# Patient Record
Sex: Female | Born: 1995 | Race: White | Hispanic: No | Marital: Single | Smoking: Never smoker
Health system: Southern US, Community
[De-identification: ages and names within clinical notes are randomized; demographics above are authoritative.]

## PROBLEM LIST (undated history)

## (undated) DIAGNOSIS — F603 Borderline personality disorder: Secondary | ICD-10-CM

## (undated) DIAGNOSIS — F909 Attention-deficit hyperactivity disorder, unspecified type: Secondary | ICD-10-CM

## (undated) DIAGNOSIS — F329 Major depressive disorder, single episode, unspecified: Secondary | ICD-10-CM

---
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
## 2020-12-29 ENCOUNTER — Ambulatory Visit: Payer: Self-pay

## 2020-12-29 ENCOUNTER — Other Ambulatory Visit: Payer: Self-pay

## 2020-12-29 ENCOUNTER — Other Ambulatory Visit: Payer: Self-pay | Admitting: Family Medicine

## 2020-12-29 DIAGNOSIS — M25561 Pain in right knee: Secondary | ICD-10-CM

## 2021-02-21 DIAGNOSIS — M222X1 Patellofemoral disorders, right knee: Secondary | ICD-10-CM | POA: Insufficient documentation

## 2022-03-19 DIAGNOSIS — Z0283 Encounter for blood-alcohol and blood-drug test: Secondary | ICD-10-CM | POA: Insufficient documentation

## 2022-03-29 DIAGNOSIS — R0981 Nasal congestion: Secondary | ICD-10-CM | POA: Insufficient documentation

## 2022-03-29 DIAGNOSIS — J0121 Acute recurrent ethmoidal sinusitis: Secondary | ICD-10-CM | POA: Insufficient documentation

## 2022-03-29 DIAGNOSIS — G43109 Migraine with aura, not intractable, without status migrainosus: Secondary | ICD-10-CM | POA: Insufficient documentation

## 2022-03-29 DIAGNOSIS — J342 Deviated nasal septum: Secondary | ICD-10-CM | POA: Insufficient documentation

## 2022-05-29 DIAGNOSIS — J343 Hypertrophy of nasal turbinates: Secondary | ICD-10-CM | POA: Insufficient documentation

## 2022-05-29 DIAGNOSIS — J322 Chronic ethmoidal sinusitis: Secondary | ICD-10-CM | POA: Insufficient documentation

## 2022-05-29 DIAGNOSIS — J320 Chronic maxillary sinusitis: Secondary | ICD-10-CM | POA: Insufficient documentation

## 2022-07-03 ENCOUNTER — Ambulatory Visit (INDEPENDENT_AMBULATORY_CARE_PROVIDER_SITE_OTHER): Payer: BC Managed Care – PPO | Admitting: Family Medicine

## 2022-07-03 ENCOUNTER — Encounter: Payer: Self-pay | Admitting: Family Medicine

## 2022-07-03 VITALS — BP 122/74 | HR 90 | Temp 97.5°F | Ht 68.0 in | Wt 157.0 lb

## 2022-07-03 DIAGNOSIS — F909 Attention-deficit hyperactivity disorder, unspecified type: Secondary | ICD-10-CM

## 2022-07-03 DIAGNOSIS — Z789 Other specified health status: Secondary | ICD-10-CM

## 2022-07-03 DIAGNOSIS — J322 Chronic ethmoidal sinusitis: Secondary | ICD-10-CM | POA: Diagnosis not present

## 2022-07-03 DIAGNOSIS — F603 Borderline personality disorder: Secondary | ICD-10-CM

## 2022-07-03 DIAGNOSIS — F331 Major depressive disorder, recurrent, moderate: Secondary | ICD-10-CM

## 2022-07-03 NOTE — Progress Notes (Signed)
Assessment/Plan:   Problem List Items Addressed This Visit       Respiratory   Chronic ethmoidal sinusitis     Other   ADHD (attention deficit hyperactivity disorder) - Primary    Patient has a long-standing history of ADHD, currently managed with methylphenidate. Differential diagnosis: Primary ADHD remains the most likely diagnosis; however, enhanced assessment through re-evaluation or updated psychological testing may be beneficial.  Plan: Recommend follow-up with a psychiatrist for continued management of ADHD and ongoing medications due to the complex psychiatric history. If appropriate, coordinate care with former providers to ensure continuity of treatment and consider interim refills until psychiatric evaluation. Suggest behavioral therapy as a supplementary treatment.      Female-to-female transgender person    Patient is FTM transgender well-managed with ongoing hormone therapy and an upcoming hysterectomy.  Plan: Continued support for gender transition and hormone therapy as managed by [facility] or a specialist in transgender healthcare. Preoperative and postoperative support for the upcoming hysterectomy to optimize the patient's mental and physical health.      Major depressive disorder, recurrent, moderate (HCC)    Recurrent, moderate major depressive disorder presently managed with escitalopram and bupropion.  Plan: Like with ADHD, referral to psychiatric services for medication management is recommended given the patient's complex mental health history. Consider adjunctive psychotherapy for long-term management of depressive symptoms.      Relevant Medications   buPROPion (WELLBUTRIN XL) 300 MG 24 hr tablet   escitalopram (LEXAPRO) 20 MG tablet   Borderline personality disorder (HCC)   The patient left the appointment irate due to prescription refills not being issued. Detailed explanation regarding the reasoning was provided. Referrals for further  psychiatric care were offered but not pursued at the time of the visit.  Medications Discontinued During This Encounter  Medication Reason   ARIPiprazole (ABILIFY) 5 MG tablet    methylphenidate 54 MG PO CR tablet    sertraline (ZOLOFT) 100 MG tablet    cephALEXin (KEFLEX) 500 MG capsule     No follow-ups on file.    Subjective:   Encounter date: 07/03/2022  Deashia Peri is a 27 y.o. 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   She  has a past medical history of ADHD (attention deficit hyperactivity disorder), Borderline personality disorder (HCC), and Major depressive disorder..   Chief Complaint: Justiss Ginsberg, a 27 year old female, established care with concerns about prescription refills.   History of Present Illness:  Establishing Care.  The patient expressed frustration regarding delays in refilling prescriptions, specifically methylphenidate and hormone management prompting patient to want to transfer care.  Gender dysphoria. The patient is a female-to-female transgender person and has been on testosterone for 7 years.  Patient currently gets care through [facility].  Patient has upcoming hysterectomy planned in June.  Behavioral Health including ADHD, Borderline personality disorder, Major Depressive Disorder.  For ADHD, patient has been on methylphenidate for several years.  Regarding depression, patient currently on escitalopram and Wellbutrin.   She reports a history of multiple hospitalizations with suicidal ideation and prior suicide attempts.  For ongoing medication management, she has followed with both primary care  and psychiatry in the past.  She is seeking a provider who can manage psychiatric and hormone therapy.  Nasal septal issues, with recent ENT follow-up last month.  History reviewed. No pertinent surgical history.  Outpatient Medications Prior to Visit  Medication Sig Dispense Refill   buPROPion (WELLBUTRIN XL) 300 MG 24 hr tablet Take one tablet (300 mg dose) by mouth daily.     escitalopram (LEXAPRO) 20 MG tablet Take 1 tablet by mouth daily.     methylphenidate 54 MG PO CR tablet Take 54 mg by mouth.     SYRINGE-NEEDLE, DISP, 3 ML (B-D 3CC LUER-LOK SYR 18GX1-1/2) 18G X 1-1/2" 3 ML MISC BD Luer-Lok Syringe 3 mL 18 x 1 1/2"  USE AS DIRECTED     testosterone enanthate (DELATESTRYL) 200 MG/ML injection Testosterone Enanthate 200 mg/mL     methylphenidate 54 MG PO CR tablet Take 54 mg by mouth every morning.     ARIPiprazole (ABILIFY) 5 MG tablet Take 5 mg by mouth daily.     cephALEXin (KEFLEX) 500 MG capsule Take 1 capsule (500 mg total) by mouth 2 (two) times daily. For 7 days 14 capsule 0   sertraline (ZOLOFT) 100 MG tablet Take 150 mg by mouth daily.     No facility-administered medications prior to visit.    History reviewed. No pertinent family history.  Social History   Socioeconomic History   Marital status: Single    Spouse name: Not on file   Number of children: Not on file   Years of education: Not on file   Highest education level: Not on file  Occupational History   Not on file  Tobacco Use   Smoking status: Never    Passive exposure: Never   Smokeless tobacco: Never  Substance and Sexual Activity   Alcohol use: No   Drug use: No   Sexual activity: Not on file  Other Topics Concern   Not on file  Social History Narrative   Not on file   Social Determinants of Health   Financial  Resource Strain: Not on file  Food Insecurity: Not on file  Transportation Needs: Not on file  Physical Activity: Not on file  Stress: Not on file  Social Connections: Not on file  Intimate Partner Violence: Not on file                                                                                                  Objective:  Physical Exam: BP 122/74 (BP Location: Left Arm, Patient Position: Sitting, Cuff Size: Large)   Pulse 90   Temp (!) 97.5 F (36.4 C) (Temporal)   Ht 5\' 8"  (1.727 m)   Wt 157 lb (71.2 kg)   SpO2 99%   BMI 23.87 kg/m     Physical Exam Constitutional:      Appearance: Normal appearance.  HENT:     Head: Normocephalic and atraumatic.     Right Ear: Hearing normal.     Left Ear: Hearing normal.     Nose: Nose normal.  Eyes:     General: No scleral  icterus.       Right eye: No discharge.        Left eye: No discharge.     Extraocular Movements: Extraocular movements intact.  Cardiovascular:     Comments: No cyanosis, no JVD Pulmonary:     Effort: Pulmonary effort is normal.     Comments: No auditory wheezing Musculoskeletal:     Comments: Normal Ambulation. No clubbing  Skin:    General: Skin is warm.     Findings: No rash.  Neurological:     General: No focal deficit present.     Mental Status: She is alert.     Cranial Nerves: No cranial nerve deficit.  Psychiatric:        Mood and Affect: Affect is angry.        Behavior: Behavior is agitated.     No results found.  No results found for this or any previous visit (from the past 2160 hour(s)).      Garner Nash, MD, MS

## 2022-07-03 NOTE — Assessment & Plan Note (Signed)
Patient is FTM transgender well-managed with ongoing hormone therapy and an upcoming hysterectomy.  Plan: Continued support for gender transition and hormone therapy as managed by [facility] or a specialist in transgender healthcare. Preoperative and postoperative support for the upcoming hysterectomy to optimize the patient's mental and physical health.

## 2022-07-03 NOTE — Assessment & Plan Note (Signed)
Recurrent, moderate major depressive disorder presently managed with escitalopram and bupropion.  Plan: Like with ADHD, referral to psychiatric services for medication management is recommended given the patient's complex mental health history. Consider adjunctive psychotherapy for long-term management of depressive symptoms.

## 2022-07-03 NOTE — Assessment & Plan Note (Signed)
Patient has a long-standing history of ADHD, currently managed with methylphenidate. Differential diagnosis: Primary ADHD remains the most likely diagnosis; however, enhanced assessment through re-evaluation or updated psychological testing may be beneficial.  Plan: Recommend follow-up with a psychiatrist for continued management of ADHD and ongoing medications due to the complex psychiatric history. If appropriate, coordinate care with former providers to ensure continuity of treatment and consider interim refills until psychiatric evaluation. Suggest behavioral therapy as a supplementary treatment.

## 2023-02-05 ENCOUNTER — Telehealth: Payer: Self-pay

## 2023-02-05 NOTE — Transitions of Care (Post Inpatient/ED Visit) (Addendum)
   02/05/2023  Name: Rachel Yates MRN: [id number] DOB: 19-Oct-1995  Today's TOC FU Call Status: Today's TOC FU Call Status:: Unsuccessful Call (1st Attempt) Unsuccessful Call (1st Attempt) Date: 02/05/23  Attempted to reach the patient regarding the most recent Inpatient/ED visit.  Follow Up Plan: Additional outreach attempts will be made to reach the patient to complete the Transitions of Care (Post Inpatient/ED visit) call.   [employer] RMA

## 2023-04-29 IMAGING — DX DG KNEE COMPLETE 4+V*R*
4 series · 4 of 4 positions shown · non-contrast
Comparison: None.

CLINICAL DATA: Patient fell in hole. Difficulty walking. Pain and
medial right knee.

EXAM:
RIGHT KNEE - COMPLETE 4+ VIEW

[knee ap]
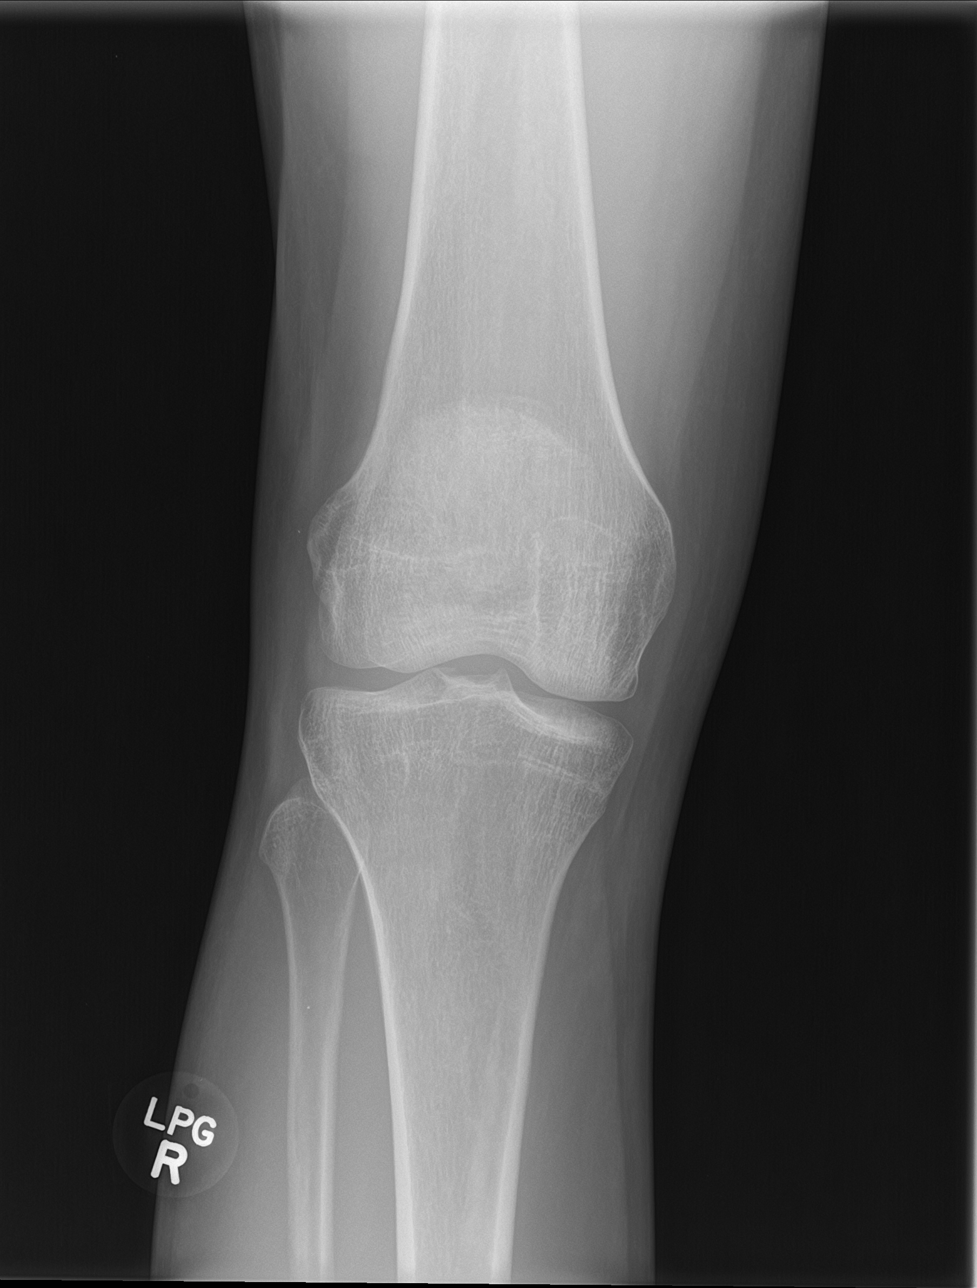

[knee obl (1 of 2)]
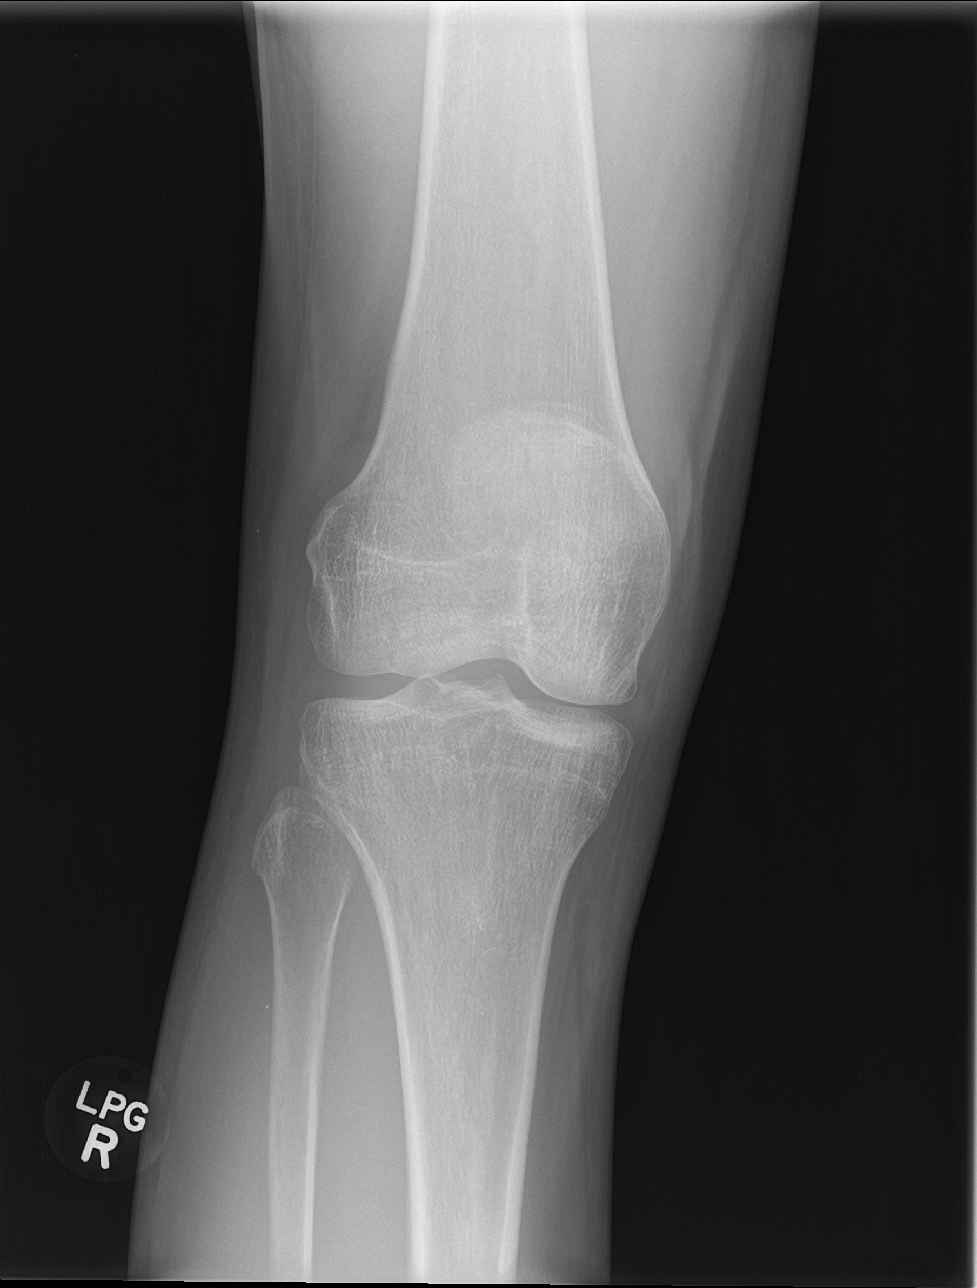

[knee obl (2 of 2)]
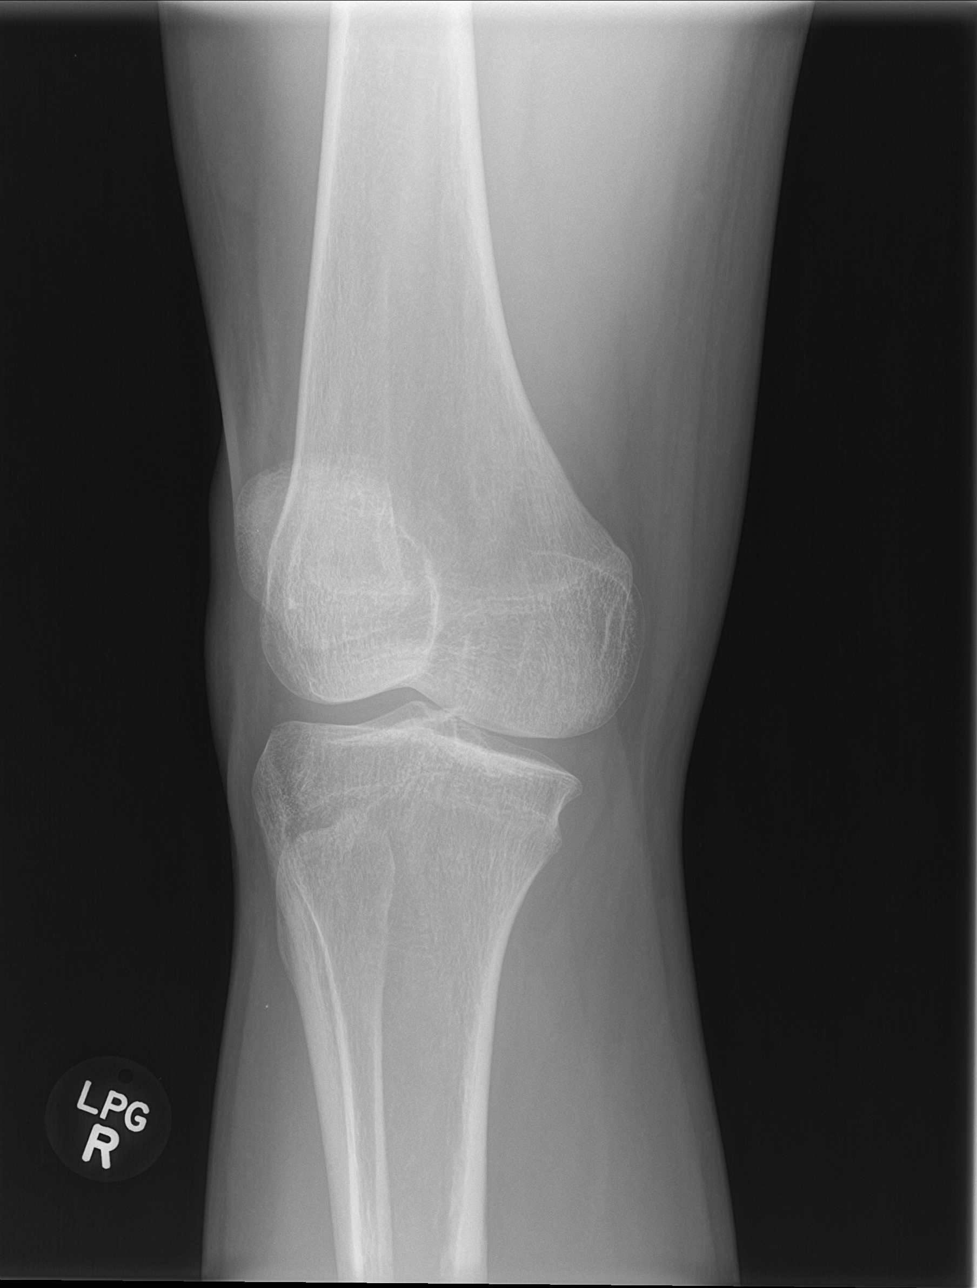

[knee lat]
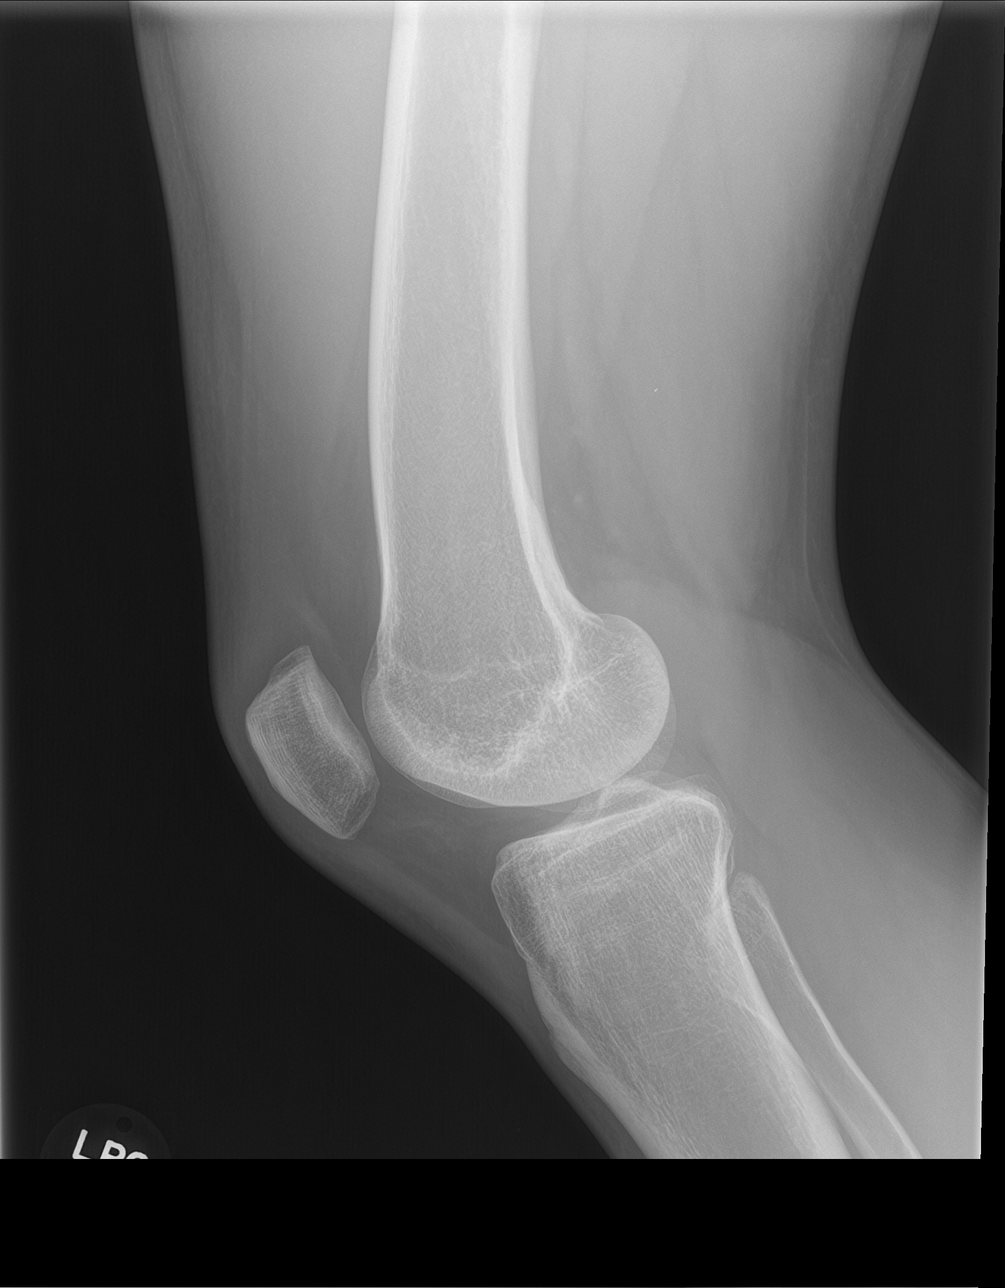

[4 of 4 positions shown; findings below may reference images not displayed]

FINDINGS: No evidence of fracture, dislocation, or joint effusion. No evidence
of arthropathy or other focal bone abnormality. Soft tissues are
unremarkable.
IMPRESSION: Negative.
